# Patient Record
Sex: Female | Born: 1955 | Race: White | Hispanic: No | Smoking: Current every day smoker
Health system: Southern US, Community
[De-identification: ages and names within clinical notes are randomized; demographics above are authoritative.]

---

## 2000-01-09 ENCOUNTER — Other Ambulatory Visit: Admission: RE | Admit: 2000-01-09 | Discharge: 2000-01-09 | Payer: Self-pay | Admitting: Obstetrics & Gynecology

## 2001-12-30 ENCOUNTER — Other Ambulatory Visit: Admission: RE | Admit: 2001-12-30 | Discharge: 2001-12-30 | Payer: Self-pay | Admitting: Obstetrics & Gynecology

## 2004-05-17 ENCOUNTER — Other Ambulatory Visit: Admission: RE | Admit: 2004-05-17 | Discharge: 2004-05-17 | Payer: Self-pay | Admitting: Obstetrics & Gynecology

## 2004-07-18 ENCOUNTER — Ambulatory Visit (HOSPITAL_COMMUNITY): Admission: RE | Admit: 2004-07-18 | Discharge: 2004-07-18 | Payer: Self-pay | Admitting: Chiropractic Medicine

## 2006-02-05 ENCOUNTER — Other Ambulatory Visit: Admission: RE | Admit: 2006-02-05 | Discharge: 2006-02-05 | Payer: Self-pay | Admitting: Obstetrics and Gynecology

## 2006-03-26 ENCOUNTER — Ambulatory Visit (HOSPITAL_COMMUNITY): Admission: RE | Admit: 2006-03-26 | Discharge: 2006-03-26 | Payer: Self-pay | Admitting: *Deleted

## 2009-09-03 ENCOUNTER — Encounter: Admission: RE | Admit: 2009-09-03 | Discharge: 2009-09-03 | Payer: Self-pay | Admitting: Obstetrics and Gynecology

## 2010-10-03 ENCOUNTER — Encounter: Admission: RE | Admit: 2010-10-03 | Discharge: 2010-10-03 | Payer: Self-pay | Admitting: Obstetrics and Gynecology

## 2011-01-04 ENCOUNTER — Encounter: Payer: Self-pay | Admitting: Obstetrics and Gynecology

## 2011-01-06 ENCOUNTER — Encounter: Payer: Self-pay | Admitting: Obstetrics and Gynecology

## 2015-06-27 ENCOUNTER — Ambulatory Visit (INDEPENDENT_AMBULATORY_CARE_PROVIDER_SITE_OTHER): Payer: BLUE CROSS/BLUE SHIELD | Admitting: Podiatry

## 2015-06-27 ENCOUNTER — Encounter: Payer: Self-pay | Admitting: Podiatry

## 2015-06-27 VITALS — BP 123/67 | HR 73 | Resp 18

## 2015-06-27 DIAGNOSIS — Q828 Other specified congenital malformations of skin: Secondary | ICD-10-CM | POA: Diagnosis not present

## 2015-06-27 DIAGNOSIS — B351 Tinea unguium: Secondary | ICD-10-CM

## 2015-06-27 NOTE — Progress Notes (Signed)
   Subjective:    Patient ID: Destiny Marshall, female    DOB: 04/13/1956, 59 y.o.   MRN: [id number]  HPI i have some fungal toenails and they are thick and discolored and i have a corn on the back of my right big toe and that has been going on for about 6 months and i have used the corn stuff and it goes away and comes back and is sore and tender and sometimes painful and touchy and my left ankle i have been seeing Dr Victorino DikeHewitt for and i have a brace on it and broke this left ankle about 15 years ago and they want to do surgery on it This patient presents to the office with three problems to her feet.  Her first problem is an ankle problem which has been treated with an AFO by Dr. Victorino DikeHewitt.  She has very thick disfigures toenail fourth toenail left foot.  She was previously treated for 3 months with lamisil but the doctor told her after three months it was not fungus.  She has painful skin lesion on big toe right foot.  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  She presents for evaluation and treatment..     Review of Systems  Constitutional: Positive for fever, chills, activity change and unexpected weight change.  HENT: Positive for hearing loss, sinus pressure and sore throat.   Eyes: Positive for visual disturbance.  Respiratory: Positive for shortness of breath.        Difficulty breathing  Cardiovascular: Positive for leg swelling.  Genitourinary: Positive for frequency.  Musculoskeletal: Positive for back pain and gait problem.       Muscle and joint pain  Skin:       Change in nails  Allergic/Immunologic: Positive for environmental allergies and food allergies.  Neurological: Positive for tremors, weakness and numbness.  Hematological: Bruises/bleeds easily.  Psychiatric/Behavioral: The patient is nervous/anxious.   All other systems reviewed and are negative.      Objective:   Physical Exam Objective: Review of past medical history, medications, social  history and allergies were performed.  Vascular: Dorsalis pedis and posterior tibial pulses were palpable B/L, capillary refill was  WNL B/L, temperature gradient was WNL B/L   Skin:  There is well circumscribed skin lesion on medial aspect right hallux under IPJ.  Nails  Thick disfigured discolored fourth toenail left foot.  Sensory: Phoebe PerchSemmes Weinstein monifilament WNL   Orthopedic: Orthopedic evaluation demonstrates all joints distal t ankle have full ROM without crepitus, muscle power WNL B/L        Assessment & Plan:  Porokeratosis right hallux.    Onychomycosis fourth toe left foot  IE.  Sample of nail taken to be placed on DTM medium.  Debrided porokeratosis right heel but pain persists.  This lesion needs to be removed and sent to pathology for examination.  To schedule in future.

## 2015-07-18 ENCOUNTER — Encounter: Payer: Self-pay | Admitting: *Deleted

## 2015-07-18 ENCOUNTER — Encounter: Payer: Self-pay | Admitting: Podiatry

## 2015-07-18 ENCOUNTER — Ambulatory Visit (INDEPENDENT_AMBULATORY_CARE_PROVIDER_SITE_OTHER): Payer: BLUE CROSS/BLUE SHIELD | Admitting: Podiatry

## 2015-07-18 VITALS — BP 124/59 | HR 64 | Resp 18

## 2015-07-18 DIAGNOSIS — Q828 Other specified congenital malformations of skin: Secondary | ICD-10-CM

## 2015-07-18 NOTE — Progress Notes (Signed)
Subjective:     Patient ID: Destiny Marshall, female   DOB: 1956-04-30, 59 y.o.   MRN: [id number]  HPIThis patient presents to the office for planned excision of skin lesion right big toe.  She has previously treated the lesion with acid and debridement but the lesion remains and is painful.  She believes it may be caused on a metal pedal at work.   Review of Systems     Objective:   Physical Exam GENERAL APPEARANCE: Alert, conversant. Appropriately groomed. No acute distress.  VASCULAR: Pedal pulses palpable at 2/4 DP and PT bilateral.  Capillary refill time is immediate to all digits,  Proximal to distal cooling it warm to warm.  Digital hair growth is present bilateral  NEUROLOGIC: sensation is intact epicritically and protectively to 5.07 monofilament at 5/5 sites bilateral.  Light touch is intact bilateral, vibratory sensation intact bilateral, achilles tendon reflex is intact bilateral.  MUSCULOSKELETAL: acceptable muscle strength, tone and stability bilateral.  Intrinsic muscluature intact bilateral.  Rectus appearance of foot and digits noted bilateral.   DERMATOLOGIC: skin color, texture, and turgor are within normal limits.  No preulcerative lesions or ulcers  are seen, no interdigital maceration noted.  No open lesions present.  Digital nails are asymptomatic. No drainage noted. Well circumscribed skin lesion medial aspect right hallux.      Assessment:     Porokeratosis right hallux     Plan:    ROV  Excision of skin lesion right hallux. This patient was anesthetized with mixture of 2% lidocaine plain and 2 % lidocaine with epi. at the site of the skin lesion.  The surgical site was then washed with betadine and alcohol.  Using a punch the lesion was excised and passed off as specimen.  The surgical site was cauterized with phenol and washed with alcohol.  The site was bandaged with neosporin, sterile 2x2 and kling.  Home instructions given. The skin lesion to be sent to pathology.   RTC 1 week

## 2015-07-23 ENCOUNTER — Telehealth: Payer: Self-pay | Admitting: *Deleted

## 2015-07-23 DIAGNOSIS — B078 Other viral warts: Secondary | ICD-10-CM

## 2015-07-23 DIAGNOSIS — B079 Viral wart, unspecified: Secondary | ICD-10-CM

## 2015-07-23 NOTE — Telephone Encounter (Signed)
Right 1st plantar toe skin wedge sent to [facility] r/o wart, sent [employer].

## 2015-07-25 ENCOUNTER — Encounter: Payer: Self-pay | Admitting: *Deleted

## 2015-07-25 ENCOUNTER — Encounter: Payer: Self-pay | Admitting: Podiatry

## 2015-07-25 ENCOUNTER — Ambulatory Visit (INDEPENDENT_AMBULATORY_CARE_PROVIDER_SITE_OTHER): Payer: BLUE CROSS/BLUE SHIELD | Admitting: Podiatry

## 2015-07-25 VITALS — BP 135/70 | HR 75 | Resp 18

## 2015-07-25 DIAGNOSIS — Z09 Encounter for follow-up examination after completed treatment for conditions other than malignant neoplasm: Secondary | ICD-10-CM

## 2015-07-25 NOTE — Progress Notes (Signed)
Subjective:     Patient ID: Destiny Marshall, female   DOB: [birth year], 59 y.o.   MRN: [id number]  HPIThis patient returns to office following excision skin lesion right big toe.  She says she did well initially and worked 2 days with minimal discomfort.  Today she has burning and pain through the bottom of right big toe.    Review of Systems     Objective:   Physical Exam GENERAL APPEARANCE: Alert, conversant. Appropriately groomed. No acute distress.  VASCULAR: Pedal pulses palpable at  [facility] and PT bilateral.  Capillary refill time is immediate to all digits,  Normal temperature gradient.  Digital hair growth is present bilateral  NEUROLOGIC: sensation is normal to 5.07 monofilament at 5/5 sites bilateral.  Light touch is intact bilateral, Muscle strength normal.  MUSCULOSKELETAL: acceptable muscle strength, tone and stability bilateral.  Intrinsic muscluature intact bilateral.  Rectus appearance of foot and digits noted bilateral.   DERMATOLOGIC: skin color, texture, and turgor are within normal limits.  No preulcerative lesions or ulcers  are seen, no interdigital maceration noted.  No open lesions present.  Digital nails are asymptomatic. No drainage noted. Well circumscribed ulcer right hallux with necrotic tissue around her her surgical site.      Assessment:     S/p skin surgery.     Plan:     ROV.  Debride necrotic tissue.  Neosporin/DSD.  Continue soaks.  Take acetominophen as needed.

## 2015-08-10 ENCOUNTER — Telehealth: Payer: Self-pay | Admitting: *Deleted

## 2015-08-10 NOTE — Telephone Encounter (Addendum)
Dr. Stacie Acres reviewed biopsy results of 07/18/2015 as scarring.  I left a message for pt to call for results.  Pt called back and I gave her the results.

## 2015-09-06 ENCOUNTER — Telehealth: Payer: Self-pay | Admitting: *Deleted

## 2015-09-06 NOTE — Telephone Encounter (Addendum)
Pt called for results of toenail fungus culture.

## 2015-10-02 ENCOUNTER — Telehealth: Payer: Self-pay | Admitting: *Deleted

## 2015-10-02 NOTE — Telephone Encounter (Signed)
Pt left name and phone.  Left message informing pt to call with message.

## 2017-09-16 ENCOUNTER — Other Ambulatory Visit: Payer: Self-pay | Admitting: Obstetrics and Gynecology

## 2017-09-16 DIAGNOSIS — N649 Disorder of breast, unspecified: Secondary | ICD-10-CM

## 2017-09-16 DIAGNOSIS — R5381 Other malaise: Secondary | ICD-10-CM

## 2019-11-29 ENCOUNTER — Other Ambulatory Visit: Payer: Self-pay | Admitting: Obstetrics and Gynecology

## 2019-11-29 DIAGNOSIS — R928 Other abnormal and inconclusive findings on diagnostic imaging of breast: Secondary | ICD-10-CM

## 2019-12-06 ENCOUNTER — Encounter: Payer: Self-pay | Admitting: Gastroenterology

## 2019-12-12 ENCOUNTER — Ambulatory Visit: Payer: Self-pay

## 2019-12-12 ENCOUNTER — Other Ambulatory Visit: Payer: Self-pay

## 2019-12-12 ENCOUNTER — Ambulatory Visit
Admission: RE | Admit: 2019-12-12 | Discharge: 2019-12-12 | Disposition: A | Discharge status: Home / Self Care | Payer: Medicare HMO | Source: Ambulatory Visit | Attending: Obstetrics and Gynecology | Admitting: Obstetrics and Gynecology

## 2019-12-12 DIAGNOSIS — R928 Other abnormal and inconclusive findings on diagnostic imaging of breast: Secondary | ICD-10-CM

## 2021-03-07 DIAGNOSIS — R0600 Dyspnea, unspecified: Secondary | ICD-10-CM | POA: Diagnosis not present

## 2021-03-07 DIAGNOSIS — J4540 Moderate persistent asthma, uncomplicated: Secondary | ICD-10-CM | POA: Diagnosis not present

## 2021-06-03 IMAGING — MG MM DIGITAL DIAGNOSTIC UNILAT*R* W/ TOMO W/ CAD
4 series · 4 of 12 positions shown · non-contrast
Comparison: 11/25/2019 and earlier

CLINICAL DATA: The patient returns after screening study for
evaluation of possible RIGHT breast asymmetry.

EXAM:
DIGITAL DIAGNOSTIC UNILATERAL RIGHT MAMMOGRAM WITH CAD AND TOMO

[R ML synth-2D]
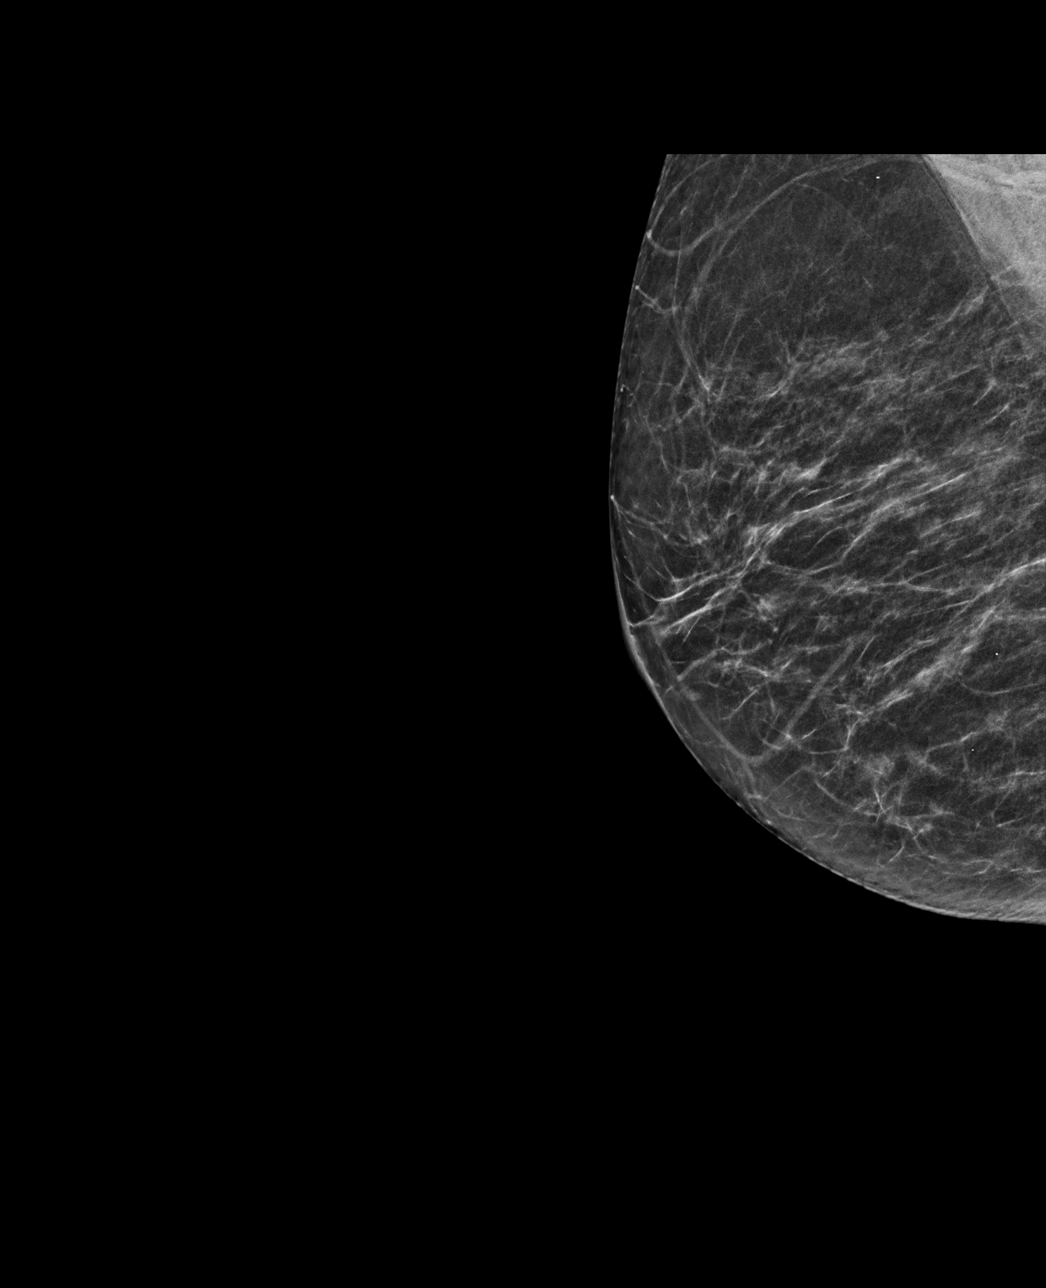

[R MLO synth-2D]
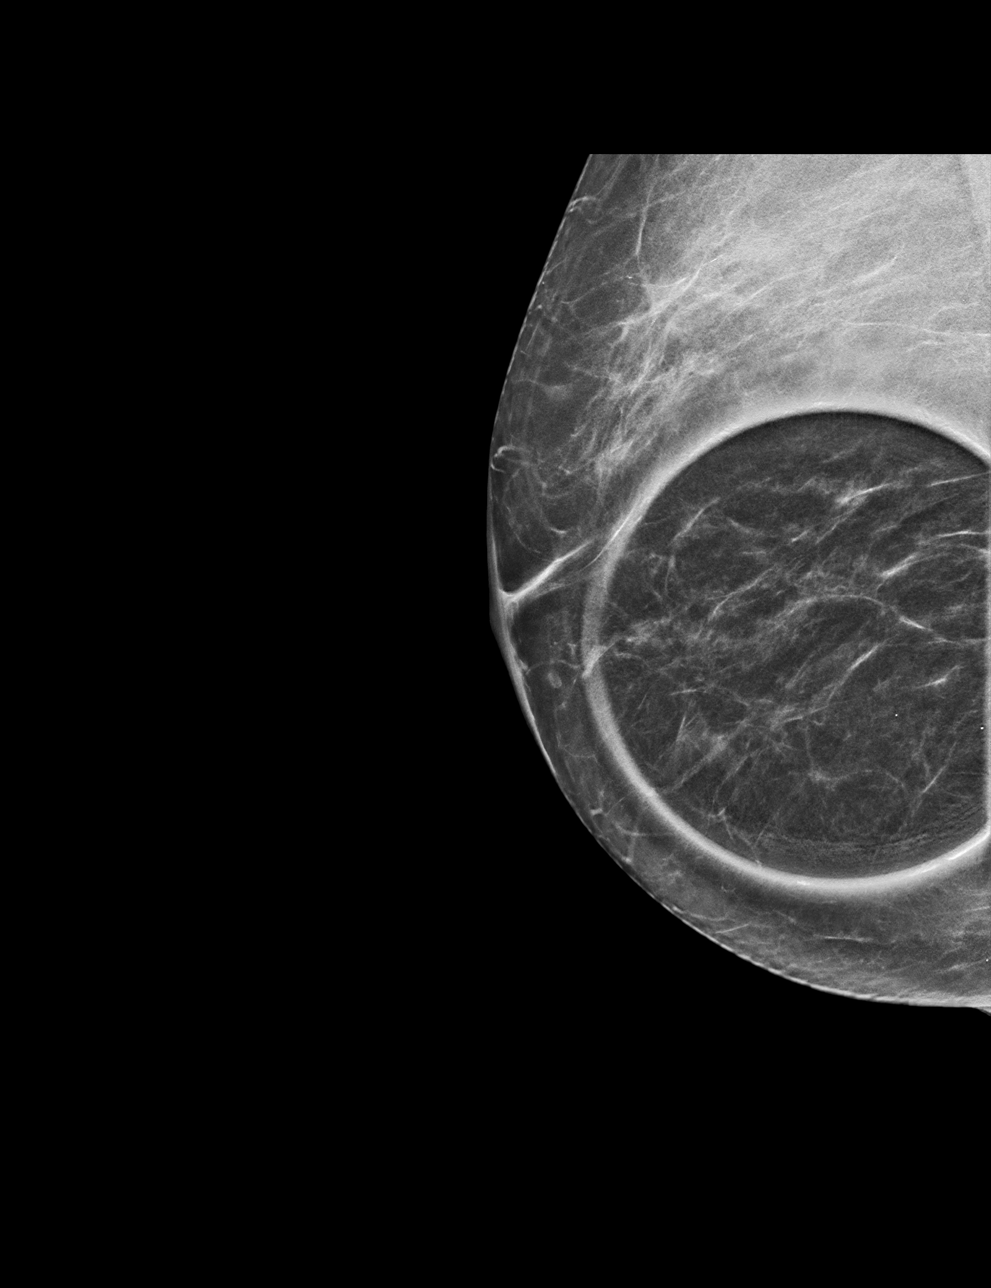

[R MLO tomo · tomo slice 25/50.0]
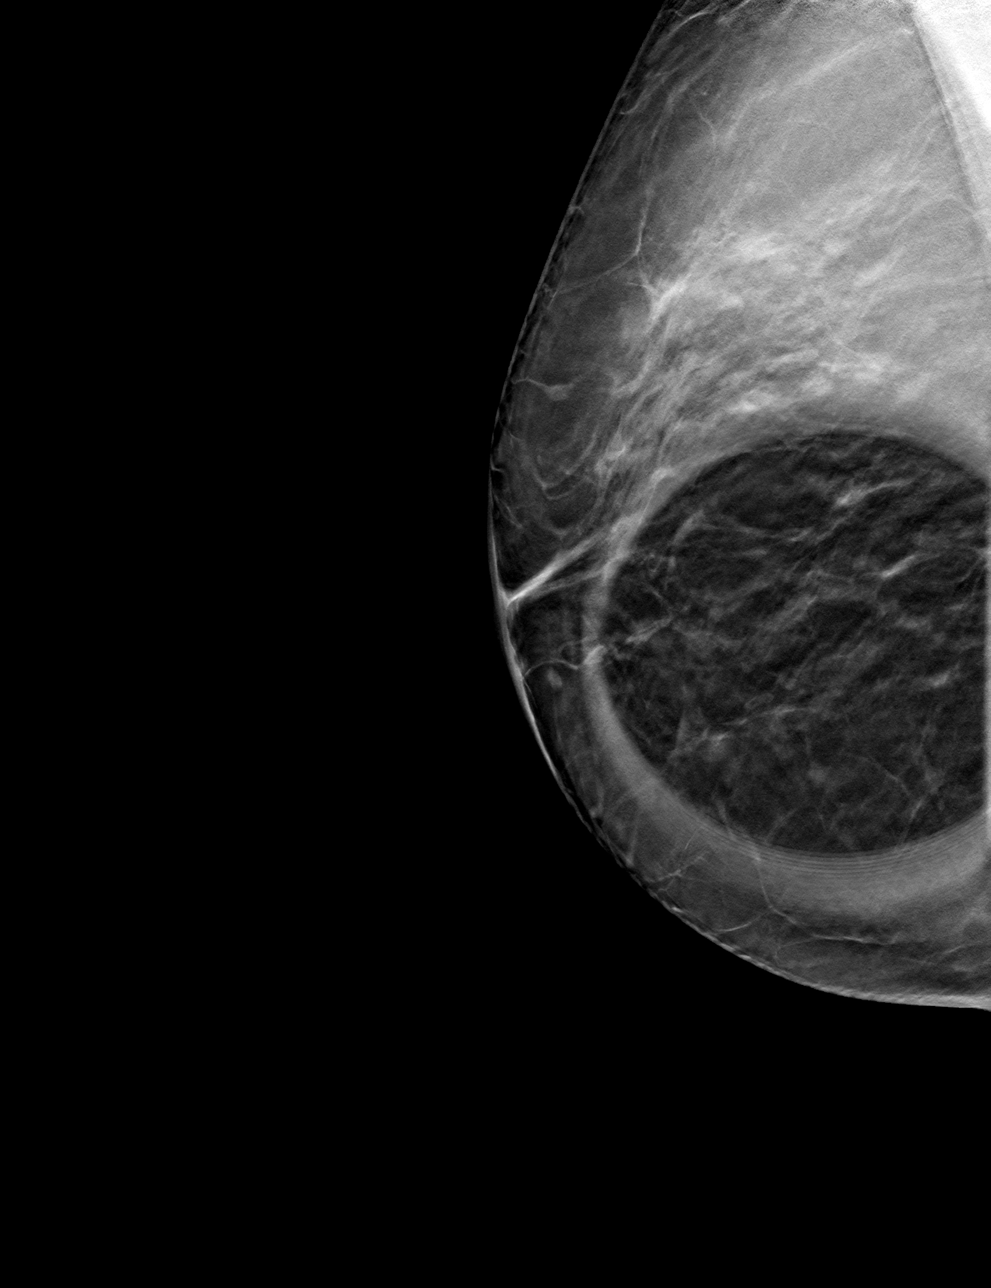

[R ML tomo · tomo slice 27/54.0]
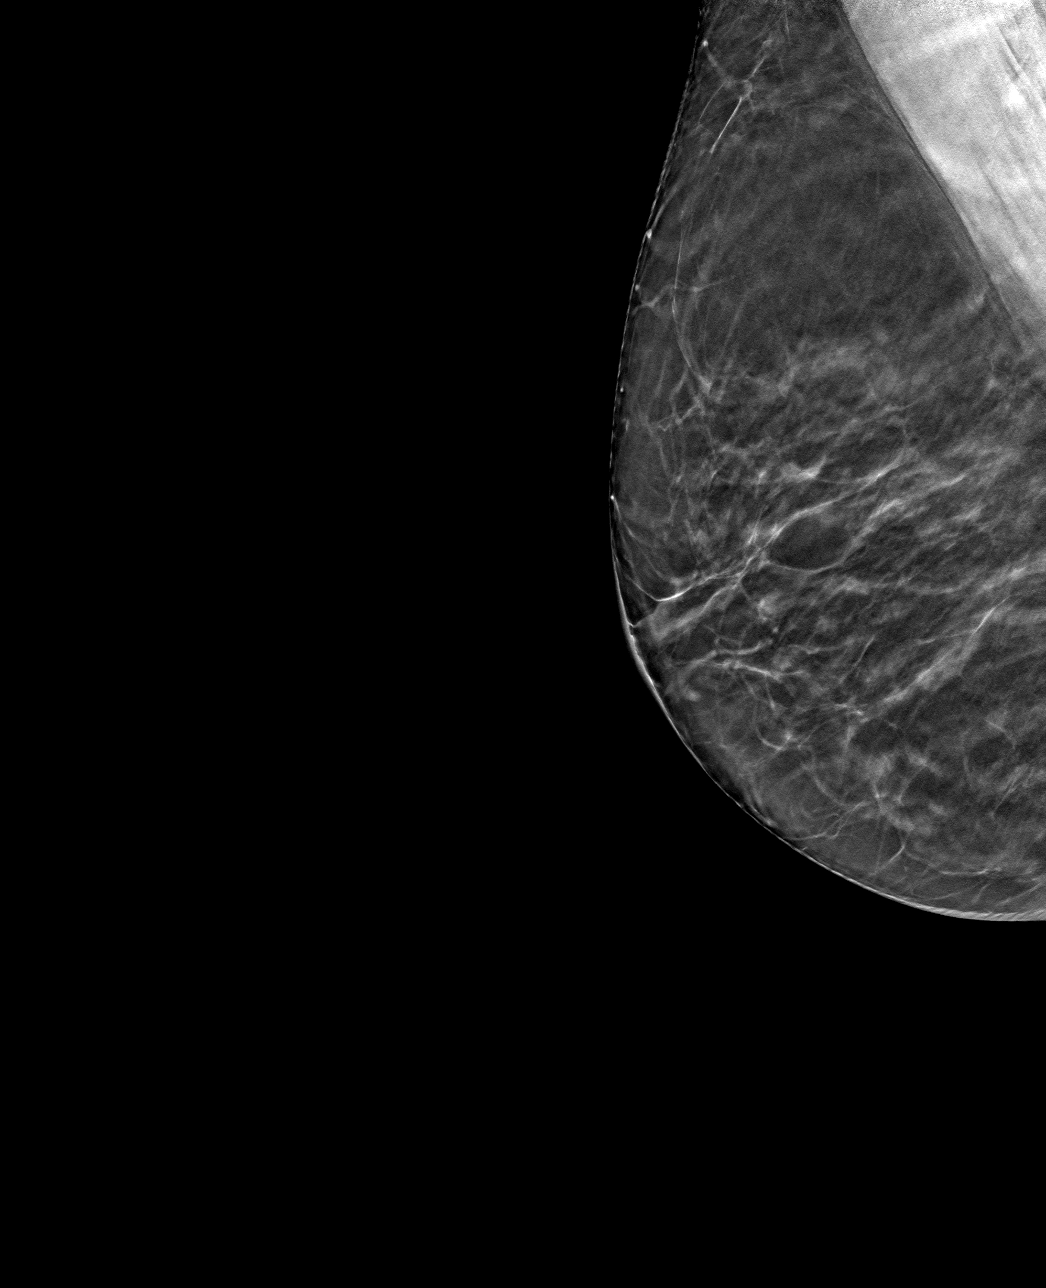

[4 of 12 positions shown; findings below may reference images not displayed]

ACR Breast Density Category b: There are scattered areas of
fibroglandular density.
FINDINGS: Additional 2-D and 3-D images are performed. These views show no
persistent abnormality in the LOWER portion of the RIGHT breast.

Mammographic images were processed with CAD.
IMPRESSION: No mammographic evidence for malignancy.

RECOMMENDATION:
Screening mammogram in one year.(Code:TO-S-RU2)

I have discussed the findings and recommendations with the patient.
If applicable, a reminder letter will be sent to the patient
regarding the next appointment.

BI-RADS CATEGORY  1: Negative.

## 2024-03-10 ENCOUNTER — Other Ambulatory Visit: Payer: Self-pay | Admitting: Obstetrics and Gynecology

## 2024-03-10 DIAGNOSIS — R928 Other abnormal and inconclusive findings on diagnostic imaging of breast: Secondary | ICD-10-CM

## 2024-03-24 ENCOUNTER — Ambulatory Visit
Admission: RE | Admit: 2024-03-24 | Discharge: 2024-03-24 | Disposition: A | Discharge status: Home / Self Care | Source: Ambulatory Visit | Attending: Obstetrics and Gynecology | Admitting: Obstetrics and Gynecology

## 2024-03-24 ENCOUNTER — Other Ambulatory Visit: Payer: Self-pay | Admitting: Obstetrics and Gynecology

## 2024-03-24 DIAGNOSIS — R928 Other abnormal and inconclusive findings on diagnostic imaging of breast: Secondary | ICD-10-CM

## 2024-03-24 DIAGNOSIS — N6320 Unspecified lump in the left breast, unspecified quadrant: Secondary | ICD-10-CM

## 2024-03-31 ENCOUNTER — Ambulatory Visit
Admission: RE | Admit: 2024-03-31 | Discharge: 2024-03-31 | Disposition: A | Discharge status: Home / Self Care | Source: Ambulatory Visit | Attending: Obstetrics and Gynecology | Admitting: Obstetrics and Gynecology

## 2024-03-31 DIAGNOSIS — N6320 Unspecified lump in the left breast, unspecified quadrant: Secondary | ICD-10-CM

## 2024-03-31 HISTORY — PX: BREAST BIOPSY: SHX20

## 2024-04-01 LAB — SURGICAL PATHOLOGY

## 2024-07-06 ENCOUNTER — Encounter: Payer: Self-pay | Admitting: Family Medicine

## 2024-07-06 ENCOUNTER — Other Ambulatory Visit: Payer: Self-pay | Admitting: Obstetrics and Gynecology

## 2024-07-06 DIAGNOSIS — N6320 Unspecified lump in the left breast, unspecified quadrant: Secondary | ICD-10-CM

## 2024-09-27 ENCOUNTER — Other Ambulatory Visit: Payer: Self-pay | Admitting: Obstetrics and Gynecology

## 2024-09-27 ENCOUNTER — Ambulatory Visit
Admission: RE | Admit: 2024-09-27 | Discharge: 2024-09-27 | Disposition: A | Source: Ambulatory Visit | Attending: Obstetrics and Gynecology | Admitting: Obstetrics and Gynecology

## 2024-09-27 DIAGNOSIS — N6320 Unspecified lump in the left breast, unspecified quadrant: Secondary | ICD-10-CM

## 2024-09-27 DIAGNOSIS — N6322 Unspecified lump in the left breast, upper inner quadrant: Secondary | ICD-10-CM

## 2025-03-29 ENCOUNTER — Encounter

## 2025-03-29 ENCOUNTER — Other Ambulatory Visit
